# Patient Record
Sex: Female | Born: 1960 | Race: White | Hispanic: No | Marital: Married
Health system: Southern US, Community
[De-identification: ages and names within clinical notes are randomized; demographics above are authoritative.]

---

## 1999-01-26 ENCOUNTER — Ambulatory Visit (HOSPITAL_COMMUNITY): Admission: RE | Admit: 1999-01-26 | Discharge: 1999-01-26 | Payer: Self-pay | Admitting: Obstetrics and Gynecology

## 1999-01-26 ENCOUNTER — Encounter: Payer: Self-pay | Admitting: Obstetrics and Gynecology

## 1999-06-21 ENCOUNTER — Inpatient Hospital Stay (HOSPITAL_COMMUNITY): Admission: AD | Admit: 1999-06-21 | Discharge: 1999-06-24 | Payer: Self-pay | Admitting: Obstetrics and Gynecology

## 2008-08-20 ENCOUNTER — Inpatient Hospital Stay: Payer: Self-pay | Admitting: Surgery

## 2009-02-20 ENCOUNTER — Ambulatory Visit: Payer: Self-pay | Admitting: Surgery

## 2009-03-07 ENCOUNTER — Ambulatory Visit: Payer: Self-pay | Admitting: Surgery

## 2009-03-14 ENCOUNTER — Inpatient Hospital Stay: Payer: Self-pay | Admitting: Surgery

## 2009-11-22 IMAGING — CR DG BARIUM ENEMA
1 series · 1 of 1 positions shown · non-contrast
Comparison: none

REASON FOR EXAM: ruptured diverticulitis July 2008 had colostomy
COMMENTS:

[view not recorded]
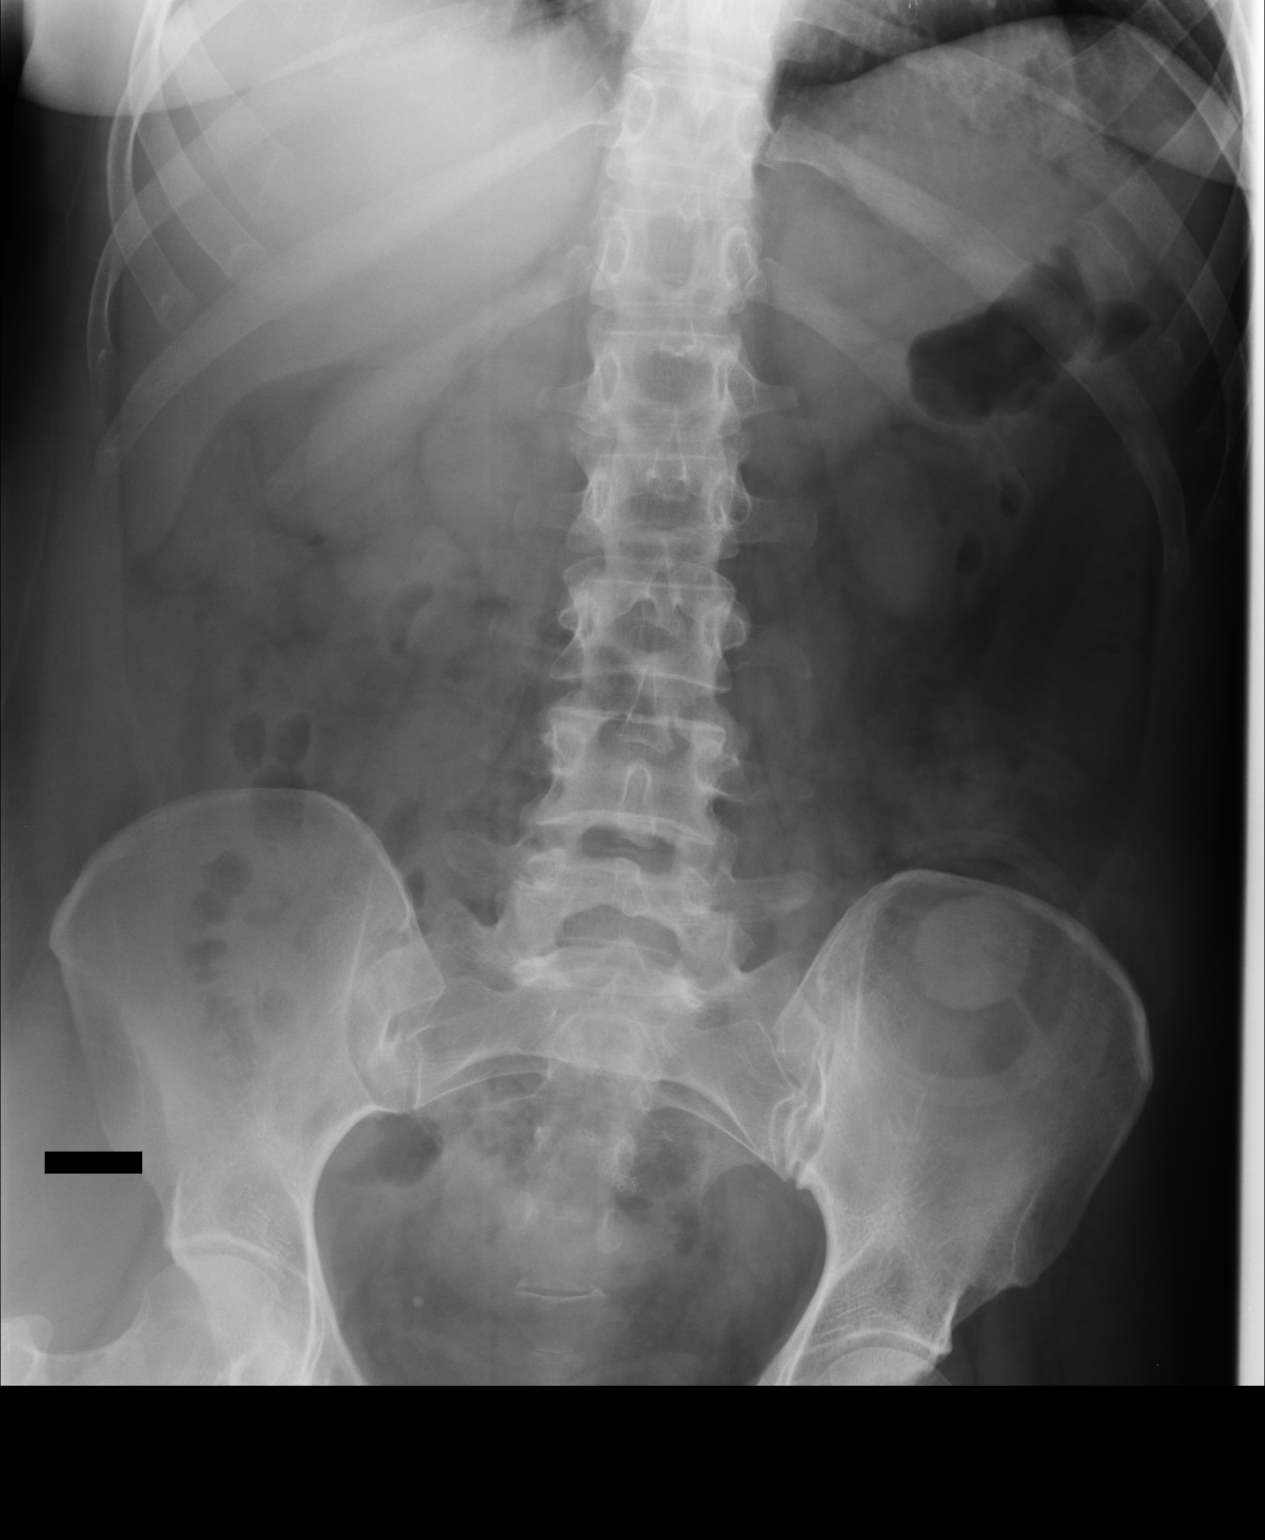

[1 of 1 positions shown; findings below may reference images not displayed]

PROCEDURE:     FL  - FL BARIUM ENEMA (COLON)  - February 20, 2009  [DATE]

RESULT:     Barium Enema was performed through the rectum into the patient's
colostomy site. No focal lesions are identified. The appendix is normal. AP
views of the rectum could not be obtained as the patient was barely able to
contain the barium in the rectum
IMPRESSION: Normal exam. Widely patent rectal pouch and widely patent
remainder of the colon.

## 2010-02-19 ENCOUNTER — Ambulatory Visit: Payer: Self-pay

## 2010-03-20 ENCOUNTER — Ambulatory Visit: Payer: Self-pay

## 2010-04-05 ENCOUNTER — Inpatient Hospital Stay: Payer: Self-pay | Admitting: Surgery

## 2011-01-06 IMAGING — CT CT CHEST W/ CM
1 series · 15 of 32 positions shown, 19 images · IV contrast (APPLIED)
Comparison: none

REASON FOR EXAM: respiratory insufficiency
COMMENTS:

[Series 4: soft tissue · axial · 0.72mm/px · z∈[+230,+454]mm · 15 of 86 slices shown, 19 images]
[im 7/86  mediastinal]
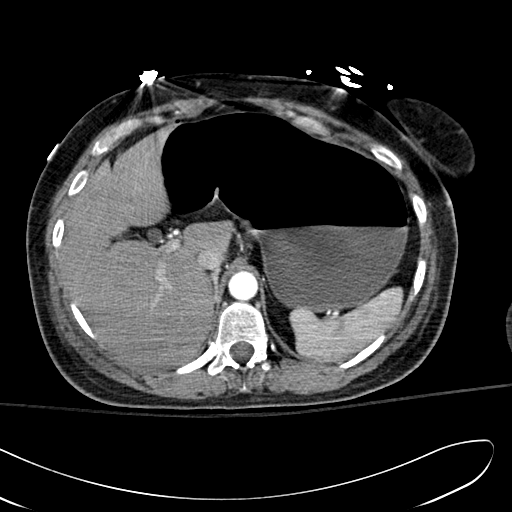
[im 7/86  lung]
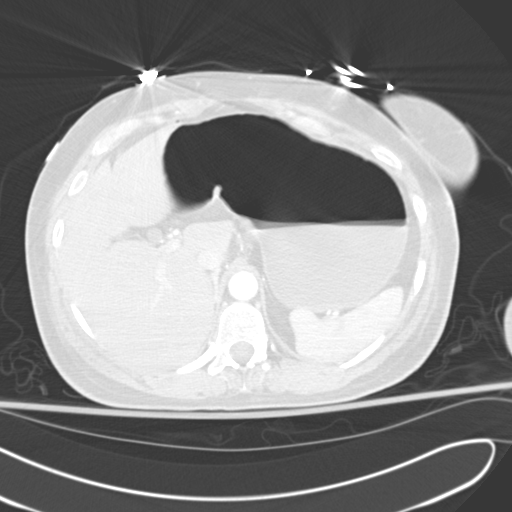
[im 13/86  lung]
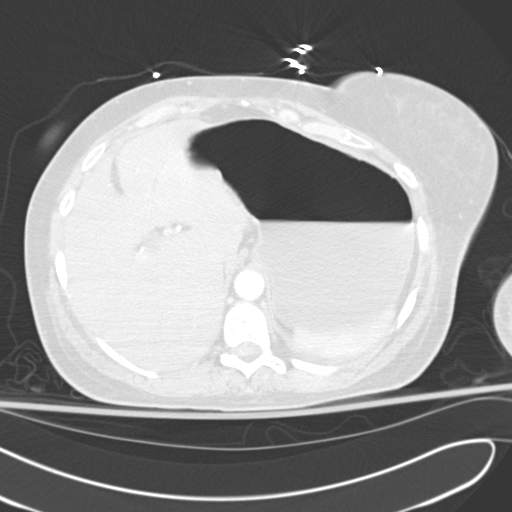
[im 18/86  lung]
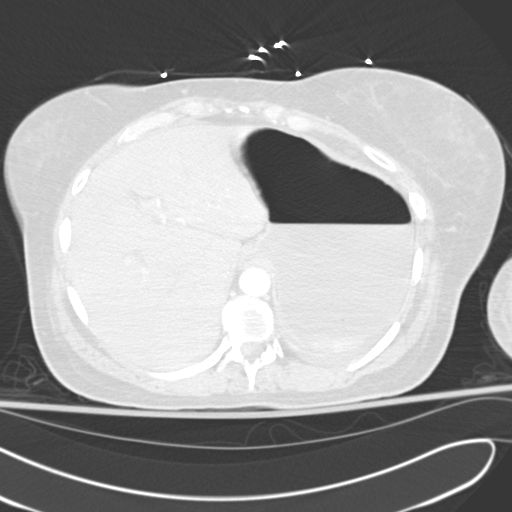
[im 23/86  lung]
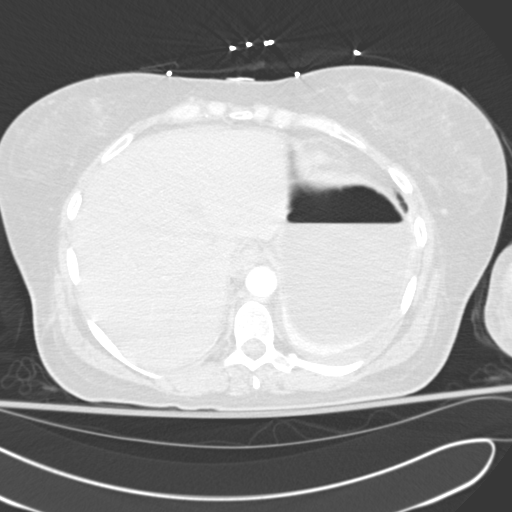
[im 29/86  mediastinal]
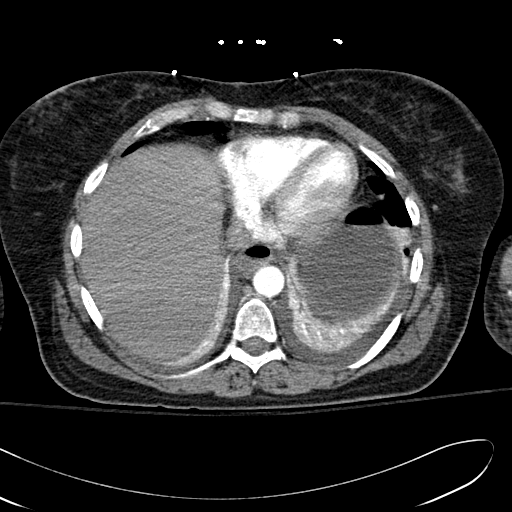
[im 29/86  lung]
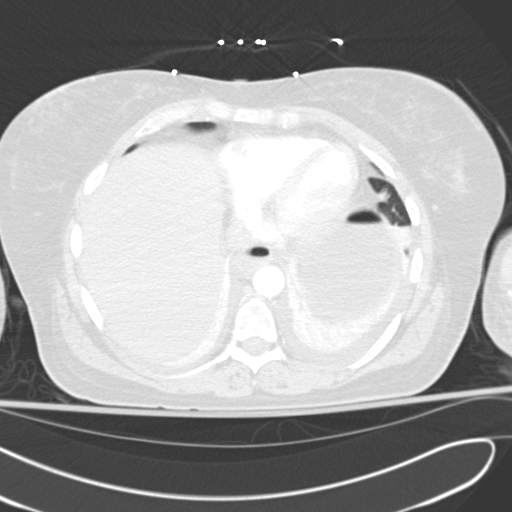
[im 35/86  lung]
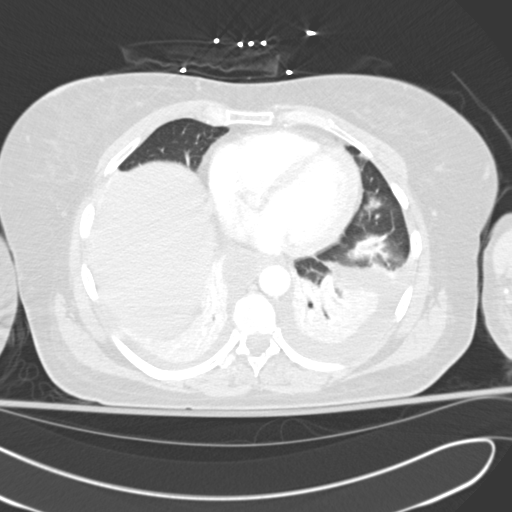
[im 41/86  lung]
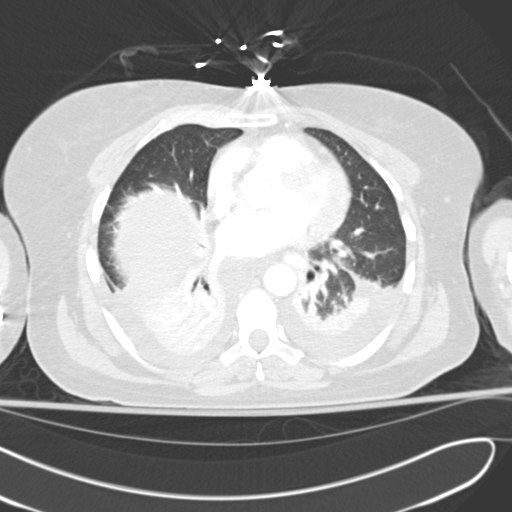
[im 46/86  lung]
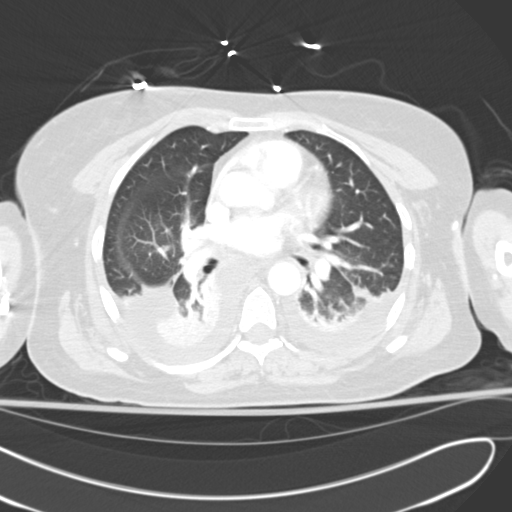
[im 51/86  mediastinal]
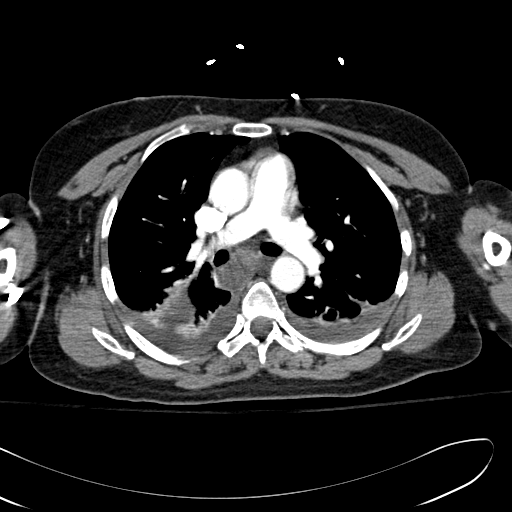
[im 51/86  lung]
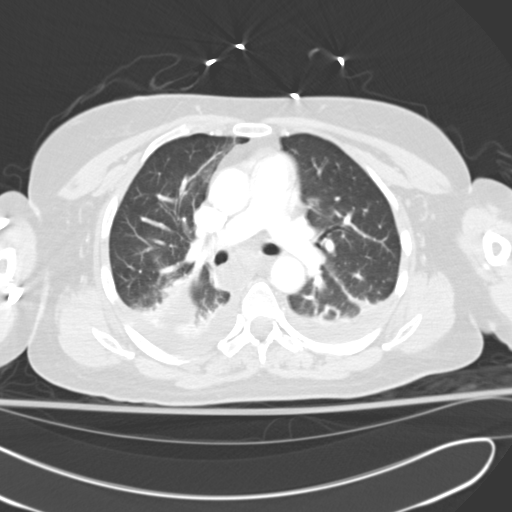
[im 54/86  lung]
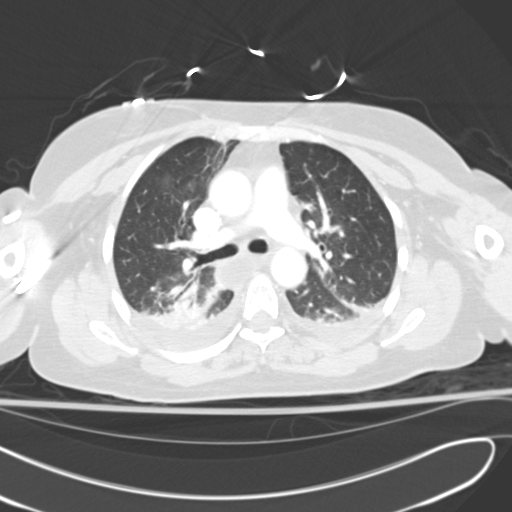
[im 60/86  lung]
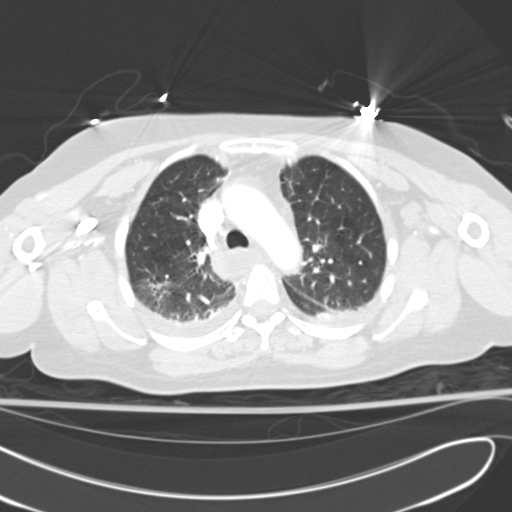
[im 67/86  lung]
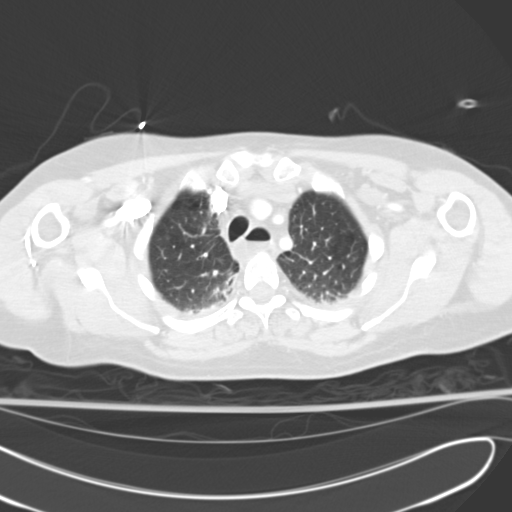
[im 70/86  mediastinal]
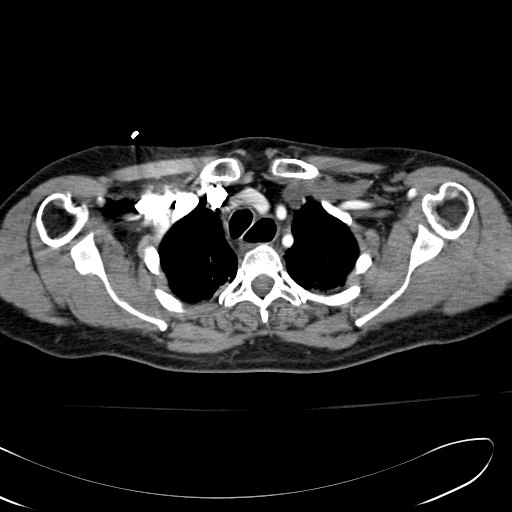
[im 70/86  lung]
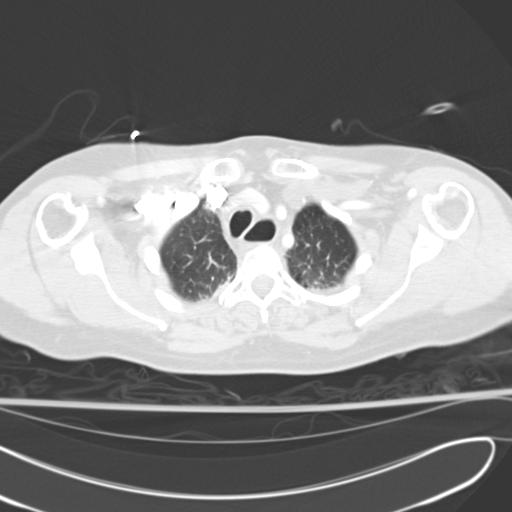
[im 76/86  lung]
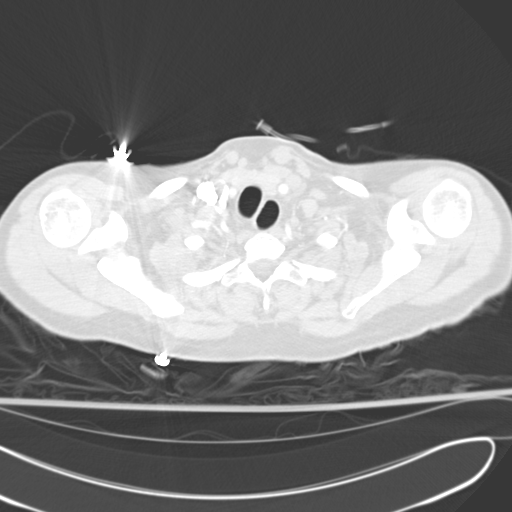
[im 82/86  lung]
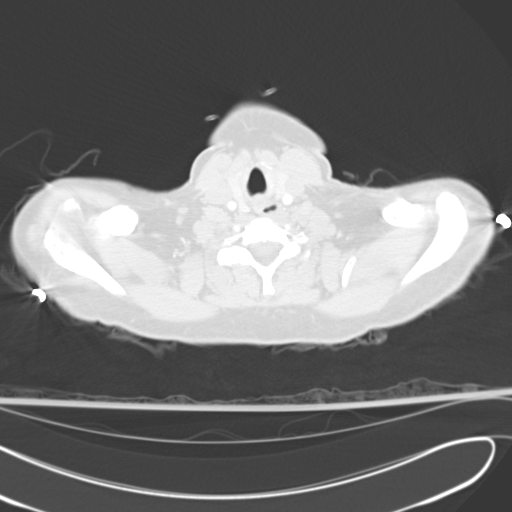

[15 of 32 positions shown; findings below may reference images not displayed]

PROCEDURE:     CT  - CT CHEST (FOR PE) W  - April 06, 2010  [DATE]

RESULT:     CT of the chest is performed with 85 mL of Isovue-NJV iodinated
intravenous contrast. Images are reconstructed at 3 mm slice thickness in
the axial plane. There is no previous exam for comparison.

Incidental note is made of a large amount of fluid and air within the
stomach. This is incompletely imaged. The patient may benefit from a
nasogastric tube.

Small bilateral pleural effusions and bilateral lower lobe areas of
infiltrate versus atelectasis with minimal upper lobe atelectasis versus
infiltrate are noted. There is no edema or pneumothorax. Minimal lingular
atelectasis is demonstrated. There is an air-fluid level within the distal
esophagus with a distended fluid-filled esophagus suggestive of possible
gastroesophageal reflux. There is air within the proximal esophagus. A
borderline enlarged para-aortic lymph node is seen adjacent to the aortic
arch measuring up to 1.5 cm long axis and only 7.7 mm short axis on image
29. The thoracic aorta is normal in caliber and shows no evidence of
dissection. There is no evidence of a filling defect within the well
opacified pulmonary arterial system to suggest PE.
IMPRESSION: 1. Gastric distention with fluid and air in the esophagus and stomach. The
patient may benefit from a nasogastric tube or decompression.
2. Small bilateral pleural effusions with lower lobe atelectasis versus
infiltrate and probable minimal upper lobe atelectasis.
3. No pulmonary embolism, aortic aneurysm or thoracic aortic dissection.

## 2017-10-30 DIAGNOSIS — C4492 Squamous cell carcinoma of skin, unspecified: Secondary | ICD-10-CM

## 2017-10-30 HISTORY — DX: Squamous cell carcinoma of skin, unspecified: C44.92

## 2018-09-03 DIAGNOSIS — L570 Actinic keratosis: Secondary | ICD-10-CM

## 2018-09-03 HISTORY — DX: Actinic keratosis: L57.0

## 2019-11-26 ENCOUNTER — Ambulatory Visit: Payer: Self-pay

## 2019-11-27 ENCOUNTER — Ambulatory Visit: Payer: Self-pay

## 2019-11-28 ENCOUNTER — Ambulatory Visit: Payer: Self-pay

## 2019-12-03 ENCOUNTER — Ambulatory Visit: Payer: Self-pay

## 2020-08-03 ENCOUNTER — Encounter: Payer: Self-pay | Admitting: Dermatology

## 2020-08-03 ENCOUNTER — Other Ambulatory Visit: Payer: Self-pay

## 2020-08-03 ENCOUNTER — Ambulatory Visit: Payer: BC Managed Care – PPO | Admitting: Dermatology

## 2020-08-03 DIAGNOSIS — Z85828 Personal history of other malignant neoplasm of skin: Secondary | ICD-10-CM

## 2020-08-03 DIAGNOSIS — L820 Inflamed seborrheic keratosis: Secondary | ICD-10-CM | POA: Diagnosis not present

## 2020-08-03 DIAGNOSIS — L578 Other skin changes due to chronic exposure to nonionizing radiation: Secondary | ICD-10-CM

## 2020-08-03 DIAGNOSIS — L821 Other seborrheic keratosis: Secondary | ICD-10-CM

## 2020-08-03 NOTE — Progress Notes (Signed)
   Follow-Up Visit   Subjective  Stephanie Garrett is a 59 y.o. female who presents for the following: Other (Spot on chest and back and a few spots of left face that are irritating.).  The following portions of the chart were reviewed this encounter and updated as appropriate:  Allergies  Meds  Problems  Med Hx  Surg Hx  Fam Hx     Review of Systems:  No other skin or systemic complaints except as noted in HPI or Assessment and Plan.  Objective  Well appearing patient in no apparent distress; mood and affect are within normal limits.  A focused examination was performed including face, chest, arms, back. Relevant physical exam findings are noted in the Assessment and Plan.  Objective  right forearm x 1, chest x 7, ant forehead x 1, back x 5, left sideburn x 2, right forehead x 1, right temple x 1 (18): Erythematous keratotic or waxy stuck-on papule or plaque.    Assessment & Plan    Actinic Damage - chronic, secondary to cumulative UV radiation exposure/sun exposure over time - diffuse scaly erythematous macules with underlying dyspigmentation - Recommend daily broad spectrum sunscreen SPF 30+ to sun-exposed areas, reapply every 2 hours as needed.  - Call for new or changing lesions.  Seborrheic Keratoses - Stuck-on, waxy, tan-brown papules and plaques  - Discussed benign etiology and prognosis. - Observe - Call for any changes  History of Squamous Cell Carcinoma of the Skin - No evidence of recurrence today - No lymphadenopathy - Recommend regular full body skin exams - Recommend daily broad spectrum sunscreen SPF 30+ to sun-exposed areas, reapply every 2 hours as needed.  - Call if any new or changing lesions are noted between office visits   Inflamed seborrheic keratosis (18) right forearm x 1, chest x 7, ant forehead x 1, back x 5, left sideburn x 2, right forehead x 1, right temple x 1  Destruction of lesion - right forearm x 1, chest x 7, ant forehead x 1,  back x 5, left sideburn x 2, right forehead x 1, right temple x 1 Complexity: simple   Destruction method: cryotherapy   Informed consent: discussed and consent obtained   Timeout:  patient name, date of birth, surgical site, and procedure verified Lesion destroyed using liquid nitrogen: Yes   Region frozen until ice ball extended beyond lesion: Yes   Outcome: patient tolerated procedure well with no complications   Post-procedure details: wound care instructions given    Return in about 6 months (around 01/31/2021) for TBSE.   I, Ashok Cordia, CMA, am acting as scribe for Sarina Ser, MD .  Documentation: I have reviewed the above documentation for accuracy and completeness, and I agree with the above.  Sarina Ser, MD

## 2020-08-03 NOTE — Patient Instructions (Signed)

## 2020-08-07 ENCOUNTER — Encounter: Payer: Self-pay | Admitting: Dermatology

## 2021-02-07 ENCOUNTER — Encounter: Payer: BC Managed Care – PPO | Admitting: Dermatology

## 2021-11-27 ENCOUNTER — Other Ambulatory Visit: Payer: Self-pay

## 2021-11-27 ENCOUNTER — Ambulatory Visit: Payer: BC Managed Care – PPO | Admitting: Dermatology

## 2021-11-27 DIAGNOSIS — L720 Epidermal cyst: Secondary | ICD-10-CM

## 2021-11-27 NOTE — Progress Notes (Signed)
° °  Follow-Up Visit   Subjective  Stephanie Garrett is a 61 y.o. female who presents for the following: check spot (R glabella, ~14yrs, pt feels it will get larger and then smaller). The patient has spots, moles and lesions to be evaluated, some may be new or changing and the patient has concerns that these could be cancer.  The following portions of the chart were reviewed this encounter and updated as appropriate:   Allergies   Meds   Problems   Med Hx   Surg Hx   Fam Hx      Review of Systems:  No other skin or systemic complaints except as noted in HPI or Assessment and Plan.  Objective  Well appearing patient in no apparent distress; mood and affect are within normal limits.  A focused examination was performed including face. Relevant physical exam findings are noted in the Assessment and Plan.  R nasal bridge/medial canthus Cystic pap 1.0 x 0.7cm      Assessment & Plan  Epidermal cyst R nasal bridge/medial canthus  Growing Discussed excising, pt will schedule Pre-Op info given  Return for To be scheduled for surgery cyst R nasal bridge/medial canthus.  I, Othelia Pulling, RMA, am acting as scribe for Sarina Ser, MD . Documentation: I have reviewed the above documentation for accuracy and completeness, and I agree with the above.  Sarina Ser, MD

## 2021-11-27 NOTE — Patient Instructions (Addendum)
Pre-Operative Instructions  You are scheduled for a surgical procedure at [facility]. We recommend you read the following instructions. If you have any questions or concerns, please call the office at [phone].  Shower and wash the entire body with soap and water the day of your surgery paying special attention to cleansing at and around the planned surgery site.  Avoid aspirin or aspirin containing products at least fourteen (14) days prior to your surgical procedure and for at least one week (7 Days) after your surgical procedure. If you take aspirin on a regular basis for heart disease or history of stroke or for any other reason, we may recommend you continue taking aspirin but please notify us if you take this on a regular basis. Aspirin can cause more bleeding to occur during surgery as well as prolonged bleeding and bruising after surgery.   Avoid other nonsteroidal pain medications at least one week prior to surgery and at least one week prior to your surgery. These include medications such as Ibuprofen (Motrin, Advil and Nuprin), Naprosyn, Voltaren, Relafen, etc. If medications are used for therapeutic reasons, please inform us as they can cause increased bleeding or prolonged bleeding during and bruising after surgical procedures.   Please advise [place] if you are taking any "blood thinner" medications such as Coumadin or Dipyridamole or Plavix or similar medications. These cause increased bleeding and prolonged bleeding during procedures and bruising after surgical procedures. We may have to consider discontinuing these medications briefly prior to and shortly after your surgery if safe to do so.   Please inform us of all medications you are currently taking. All medications that are taken regularly should be taken the day of surgery as you always do. Nevertheless, we need to be informed of what medications you are taking prior to surgery to know whether they will affect the  procedure or cause any complications.   Please inform us of any medication allergies. Also inform us of whether you have allergies to Latex or rubber products or whether you have had any adverse reaction to Lidocaine or Epinephrine.  Please inform us of any prosthetic or artificial body parts such as artificial heart valve, joint replacements, etc., or similar condition that might require preoperative antibiotics.   We recommend avoidance of alcohol at least two weeks prior to surgery and continued avoidance for at least two weeks after surgery.   We recommend discontinuation of tobacco smoking at least two weeks prior to surgery and continued abstinence for at least two weeks after surgery.  Do not plan strenuous exercise, strenuous work or strenuous lifting for approximately four weeks after your surgery.   We request if you are unable to make your scheduled surgical appointment, please call us at least a week in advance or as soon as you are aware of a problem so that we can cancel or reschedule the appointment.   You MAY TAKE TYLENOL (acetaminophen) for pain as it is not a blood thinner.   PLEASE PLAN TO BE IN TOWN FOR TWO WEEKS FOLLOWING SURGERY, THIS IS IMPORTANT SO YOU CAN BE CHECKED FOR DRESSING CHANGES, SUTURE REMOVAL AND TO MONITOR FOR POSSIBLE COMPLICATIONS.     If You Need Anything After Your Visit  If you have any questions or concerns for your doctor, please call our main line at [phone] and press option 4 to reach your doctor's medical assistant. If no one answers, please leave a voicemail as directed and we will return your call as  soon as possible. Messages left after 4 pm will be answered the following business day.   You may also send [place] a message via [place]. We typically respond to MyChart messages within 1-2 business days.  For prescription refills, please ask your pharmacy to contact our office. Our fax number is [phone].  If you have an urgent issue when  the clinic is closed that cannot wait until the next business day, you can page your doctor at the number below.    Please note that while we do our best to be available for urgent issues outside of office hours, we are not available 24/7.   If you have an urgent issue and are unable to reach [place], you may choose to seek medical care at your doctor's office, retail clinic, urgent care center, or emergency room.  If you have a medical emergency, please immediately call 911 or go to the emergency department.  Pager Numbers  - Dr. Nehemiah Massed: [phone]  - Dr. Laurence Ferrari: [phone]  - Dr. Nicole Kindred: [phone]  In the event of inclement weather, please call our main line at [phone] for an update on the status of any delays or closures.  Dermatology Medication Tips: Please keep the boxes that topical medications come in in order to help keep track of the instructions about where and how to use these. Pharmacies typically print the medication instructions only on the boxes and not directly on the medication tubes.   If your medication is too expensive, please contact our office at [phone] option 4 or send [place] a message through [place].   We are unable to tell what your co-pay for medications will be in advance as this is different depending on your insurance coverage. However, we may be able to find a substitute medication at lower cost or fill out paperwork to get insurance to cover a needed medication.   If a prior authorization is required to get your medication covered by your insurance company, please allow [place] 1-2 business days to complete this process.  Drug prices often vary depending on where the prescription is filled and some pharmacies may offer cheaper prices.  The website www.goodrx.com contains coupons for medications through different pharmacies. The prices here do not account for what the cost may be with help from insurance (it may be cheaper with your insurance), but the  website can give you the price if you did not use any insurance.  - You can print the associated coupon and take it with your prescription to the pharmacy.  - You may also stop by our office during regular business hours and pick up a GoodRx coupon card.  - If you need your prescription sent electronically to a different pharmacy, notify our office through [facility] or by phone at [phone] option 4.     Si Usted Necesita Algo Despus de Su Visita  Tambin puede enviarnos un mensaje a travs de Pharmacist, community. Por lo general respondemos a los mensajes de MyChart en el transcurso de 1 a 2 das hbiles.  Para renovar recetas, por favor pida a su farmacia que se ponga en contacto con nuestra oficina. Harland Dingwall de fax es [place] [phone].  Si tiene un asunto urgente cuando la clnica est cerrada y que no puede esperar hasta el siguiente da hbil, puede llamar/localizar a su doctor(a) al nmero que aparece a continuacin.   Por favor, tenga en cuenta que aunque hacemos todo lo posible para estar disponibles para asuntos urgentes fuera del horario  de oficina, no estamos disponibles las 24 horas del da, los 7 das de la [place].   Si tiene un problema urgente y no puede comunicarse con nosotros, puede optar por buscar atencin mdica  en el consultorio de su doctor(a), en una clnica privada, en un centro de atencin urgente o en una sala de emergencias.  Si tiene Engineering geologist, por favor llame inmediatamente al 911 o vaya a la sala de emergencias.  Nmeros de bper  - Dr. Nehemiah Massed: [phone]  - Dra. Moye: [phone]  - Dra. Nicole Kindred: [phone]  En caso de inclemencias del [place], por favor llame a Johnsie Kindred principal al [phone] para una actualizacin sobre el [place] de cualquier retraso o cierre.  Consejos para la medicacin en dermatologa: Por favor, guarde las cajas en las que vienen los medicamentos de uso tpico para ayudarle a seguir las  instrucciones sobre dnde y cmo usarlos. Las farmacias generalmente imprimen las instrucciones del medicamento slo en las cajas y no directamente en los tubos del [place].   Si su medicamento es muy caro, por favor, pngase en contacto con Zigmund Daniel llamando al [phone] y presione la opcin 4 o envenos un mensaje a travs de Pharmacist, community.   No podemos decirle cul ser su copago por los medicamentos por adelantado ya que esto es diferente dependiendo de la cobertura de su seguro. Sin embargo, es posible que podamos encontrar un medicamento sustituto a Electrical engineer un formulario para que el seguro cubra el medicamento que se considera necesario.   Si se requiere una autorizacin previa para que su compaa de seguros [place] su medicamento, por favor permtanos de 1 a 2 das hbiles para completar este proceso.  Los precios de los medicamentos varan con frecuencia dependiendo del Environmental consultant de dnde se surte la receta y alguna farmacias pueden ofrecer precios ms baratos.  El sitio web www.goodrx.com tiene cupones para medicamentos de Airline pilot. Los precios aqu no tienen en cuenta lo que podra costar con la ayuda del seguro (puede ser ms barato con su seguro), pero el sitio web puede darle el precio si no utiliz Research scientist (physical sciences).  - Puede imprimir el cupn correspondiente y llevarlo con su receta a la farmacia.  - Tambin puede pasar por nuestra oficina durante el horario de atencin regular y Charity fundraiser una tarjeta de cupones de GoodRx.  - Si necesita que su receta se enve electrnicamente a una farmacia diferente, informe a nuestra oficina a travs de MyChart de [place] o por telfono llamando al [phone] y presione la opcin 4.

## 2021-11-29 ENCOUNTER — Encounter: Payer: Self-pay | Admitting: Dermatology

## 2021-12-17 ENCOUNTER — Encounter: Payer: Self-pay | Admitting: Dermatology

## 2022-01-15 ENCOUNTER — Ambulatory Visit: Payer: BC Managed Care – PPO | Admitting: Dermatology

## 2022-01-15 ENCOUNTER — Telehealth: Payer: Self-pay

## 2022-01-15 DIAGNOSIS — L722 Steatocystoma multiplex: Secondary | ICD-10-CM

## 2022-01-15 DIAGNOSIS — D485 Neoplasm of uncertain behavior of skin: Secondary | ICD-10-CM

## 2022-01-15 MED ORDER — MUPIROCIN 2 % EX OINT: 1.0000 "application " | TOPICAL_OINTMENT | Freq: Every day | CUTANEOUS | 0 refills | Status: AC

## 2022-01-15 NOTE — Patient Instructions (Signed)
Wound Care Instructions ? ?On the day following your surgery, you should begin doing daily dressing changes: ?Remove the old dressing and discard it. ?Cleanse the wound gently with tap water. This may be done in the shower or by placing a wet gauze pad directly on the wound and letting it soak for several minutes. ?It is important to gently remove any dried blood from the wound in order to encourage healing. This may be done by gently rolling a moistened Q-tip on the dried blood. Do not pick at the wound. ?If the wound should start to bleed, continue cleaning the wound, then place a moist gauze pad on the wound and hold pressure for a few minutes.  ?Make sure you then dry the skin surrounding the wound completely or the tape will not stick to the skin. Do not use cotton balls on the wound. ?After the wound is clean and dry, apply the ointment gently with a Q-tip. ?Cut a non-stick pad to fit the size of the wound. Lay the pad flush to the wound. If the wound is draining, you may want to reinforce it with a small amount of gauze on top of the non-stick pad for a little added compression to the area. ?Use the tape to seal the area completely. ?Select from the following with respect to your individual situation: ?If your wound has been stitched closed: continue the above steps 1-8 at least daily until your sutures are removed. ?If your wound has been left open to heal: continue steps 1-8 at least daily for the first 3-4 weeks. ?We would like for you to take a few extra precautions for at least the next week. ?Sleep with your head elevated on pillows if our wound is on your head. ?Do not bend over or lift heavy items to reduce the chance of elevated blood pressure to the wound ?Do not participate in particularly strenuous activities. ? ? ?Below is a list of dressing supplies you might need.  ?Cotton-tipped applicators - Q-tips ?Gauze pads (2x2 and/or 4x4) - All-Purpose Sponges ?Non-stick dressing material - Telfa ?Tape -  Paper or Hypafix ?New and clean tube of petroleum jelly - Vaseline  ? ? ?Comments on Post-Operative Period ?Slight swelling and redness often appear around the wound. This is normal and will disappear within several days following the surgery. ?The healing wound will drain a brownish-red-yellow discharge during healing. This is a normal phase of wound healing. As the wound begins to heal, the drainage may increase in amount. Again, this drainage is normal. ?Notify us if the drainage becomes persistently bloody, excessively swollen, or intensely painful or develops a foul odor or red streaks.  ?If you should experience mild discomfort during the healing phase, you may take an aspirin-free medication such as Tylenol (acetaminophen). Notify us if the discomfort is severe or persistent. Avoid alcoholic beverages when taking pain medicine. ? ?In Case of Wound Hemorrhage ?A wound hemorrhage is when the bandage suddenly becomes soaked with bright red blood and flows profusely. If this happens, sit down or lie down with your head elevated. If the wound has a dressing on it, do not remove the dressing. Apply pressure to the existing gauze. If the wound is not covered, use a gauze pad to apply pressure and continue applying the pressure for 20 minutes without peeking. DO NOT COVER THE WOUND WITH A LARGE TOWEL OR WASH CLOTH. Release your hand from the wound site but do not remove the dressing. If the bleeding has stopped,   gently clean around the wound. Leave the dressing in place for 24 hours if possible. This wait time allows the blood vessels to close off so that you do not spark a new round of bleeding by disrupting the newly clotted blood vessels with an immediate dressing change. If the bleeding does not subside, continue to hold pressure. If matters are out of your control, contact an After Hours clinic or go to the Emergency Room. ? ? ? ?If You Need Anything After Your Visit ? ?If you have any questions or concerns for  your doctor, please call our main line at [phone] and press option 4 to reach your doctor's medical assistant. If no one answers, please leave a voicemail as directed and we will return your call as soon as possible. Messages left after 4 pm will be answered the following business day.  ? ?You may also send [place] a message via MyChart. We typically respond to MyChart messages within 1-2 business days. ? ?For prescription refills, please ask your pharmacy to contact our office. Our fax number is [phone]. ? ?If you have an urgent issue when the clinic is closed that cannot wait until the next business day, you can page your doctor at the number below.   ? ?Please note that while we do our best to be available for urgent issues outside of office hours, we are not available 24/7.  ? ?If you have an urgent issue and are unable to reach [place], you may choose to seek medical care at your doctor's office, retail clinic, urgent care center, or emergency room. ? ?If you have a medical emergency, please immediately call 911 or go to the emergency department. ? ?Pager Numbers ? ?- Dr. Nehemiah Massed: [phone] ? ?- Dr. Laurence Ferrari: [phone] ? ?- Dr. Nicole Kindred: [phone] ? ?In the event of inclement weather, please call our main line at [phone] for an update on the status of any delays or closures. ? ?Dermatology Medication Tips: ?Please keep the boxes that topical medications come in in order to help keep track of the instructions about where and how to use these. Pharmacies typically print the medication instructions only on the boxes and not directly on the medication tubes.  ? ?If your medication is too expensive, please contact our office at [phone] option 4 or send [place] a message through [place].  ? ?We are unable to tell what your co-pay for medications will be in advance as this is different depending on your insurance coverage. However, we may be able to find a substitute medication at lower cost or fill out  paperwork to get insurance to cover a needed medication.  ? ?If a prior authorization is required to get your medication covered by your insurance company, please allow [place] 1-2 business days to complete this process. ? ?Drug prices often vary depending on where the prescription is filled and some pharmacies may offer cheaper prices. ? ?The website www.goodrx.com contains coupons for medications through different pharmacies. The prices here do not account for what the cost may be with help from insurance (it may be cheaper with your insurance), but the website can give you the price if you did not use any insurance.  ?- You can print the associated coupon and take it with your prescription to the pharmacy.  ?- You may also stop by our office during regular business hours and pick up a GoodRx coupon card.  ?- If you need your prescription sent electronically to a different pharmacy, notify our  office through [facility] or by phone at [phone] option 4. ? ? ? ? ?Si Usted Necesita Algo Despu?s de Su Visita ? ?Tambi?n puede enviarnos un mensaje a trav?s de MyChart. Por lo general respondemos a los mensajes de MyChart en el transcurso de 1 a 2 d?as h?biles. ? ?Para renovar recetas, por favor pida a su farmacia que se ponga en contacto con nuestra oficina. Nuestro n?mero de fax es el [phone]. ? ?Si tiene un asunto urgente cuando la cl?nica est? cerrada y que no puede esperar hasta el siguiente d?a h?bil, puede llamar/localizar a su doctor(a) al n?mero que aparece a continuaci?n.  ? ?Por favor, tenga en cuenta que aunque hacemos todo lo posible para estar disponibles para asuntos urgentes fuera del horario de oficina, no estamos disponibles las 24 horas del d?a, los 7 d?as de la semana.  ? ?Si tiene un problema urgente y no puede comunicarse con nosotros, puede optar por buscar atenci?n m?dica  en el consultorio de su doctor(a), en una cl?nica privada, en un centro de atenci?n urgente o en una sala de  emergencias. ? ?Si tiene Engineer, maintenance (IT) m?dica, por favor llame inmediatamente al 911 o vaya a la sala de emergencias. ? ?N?meros de b?per ? ?- Dr. Nehemiah Massed: [phone] ? ?- Dra. Moye: [phone] ? ?- Dra

## 2022-01-15 NOTE — Telephone Encounter (Signed)
Pt doing fine after today's surgery./sh 

## 2022-01-15 NOTE — Progress Notes (Signed)
? ?  Follow-Up Visit ?  ?Subjective  ?Stephanie Garrett is a 60 y.o. female who presents for the following: Cyst (Vs other of right nasal root/right upper eyelid medial canthus - excise today). ? ?The following portions of the chart were reviewed this encounter and updated as appropriate:  ? Allergies  Meds  Problems  Med Hx  Surg Hx  Fam Hx   ?  ?Review of Systems:  No other skin or systemic complaints except as noted in HPI or Assessment and Plan. ? ?Objective  ?Well appearing patient in no apparent distress; mood and affect are within normal limits. ? ?A focused examination was performed including face. Relevant physical exam findings are noted in the Assessment and Plan. ? ?Right nasal root/ R upper eyelid medial canthus ?1.1 cm cystic papule ? ? ?Assessment & Plan  ?Neoplasm of uncertain behavior of skin ?Right nasal root/right upper eyelid medial canthus ? ?Skin excision ? ?Lesion length (cm):  1.1 ?Lesion width (cm):  1.1 ?Margin per side (cm):  0 ?Total excision diameter (cm):  1.1 ?Informed consent: discussed and consent obtained   ?Timeout: patient name, date of birth, surgical site, and procedure verified   ?Procedure prep:  Patient was prepped and draped in usual sterile fashion ?Prep type:  Isopropyl alcohol and povidone-iodine ?Anesthesia: the lesion was anesthetized in a standard fashion   ?Anesthetic:  1% lidocaine w/ epinephrine 1-100,000 buffered w/ 8.4% NaHCO3 ?Instrument used: #15 blade   ?Hemostasis achieved with: pressure   ?Hemostasis achieved with comment:  Electrocautery ?Outcome: patient tolerated procedure well with no complications   ?Post-procedure details: sterile dressing applied and wound care instructions given   ?Dressing type: bandage and pressure dressing (mupirocin)   ? ?Skin repair ?Complexity:  Simple ?Undermining: area extensively undermined   ?Undermining comment:  Undermining defect  ?Fine/surface layer approximation (top stitches):  ?Suture type: nylon   ?Stitches:  simple running   ?Suture removal (days):  7 ?Hemostasis achieved with: suture and pressure ?Outcome: patient tolerated procedure well with no complications   ?Post-procedure details: sterile dressing applied and wound care instructions given   ?Dressing type: bandage and pressure dressing (mupirocin)   ? ?mupirocin ointment (BACTROBAN) 2 % ?Apply 1 application. topically daily. With dressing changes ? ?Specimen 1 - Surgical pathology ?Differential Diagnosis: Cyst vs other ?Check Margins: No ? ?Return for 6-7 days , suture removal. ? ?I, Ashok Cordia, CMA, am acting as scribe for Sarina Ser, MD . ?Documentation: I have reviewed the above documentation for accuracy and completeness, and I agree with the above. ? ?Sarina Ser, MD ? ?

## 2022-01-17 ENCOUNTER — Telehealth: Payer: Self-pay

## 2022-01-17 NOTE — Telephone Encounter (Signed)
Called pt discussed biopsy results,  ?

## 2022-01-17 NOTE — Telephone Encounter (Signed)
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

## 2022-01-22 ENCOUNTER — Encounter: Payer: Self-pay | Admitting: Dermatology

## 2022-01-22 ENCOUNTER — Ambulatory Visit (INDEPENDENT_AMBULATORY_CARE_PROVIDER_SITE_OTHER): Payer: BC Managed Care – PPO | Admitting: Dermatology

## 2022-01-22 DIAGNOSIS — L905 Scar conditions and fibrosis of skin: Secondary | ICD-10-CM

## 2022-01-22 DIAGNOSIS — Z4802 Encounter for removal of sutures: Secondary | ICD-10-CM

## 2022-01-22 DIAGNOSIS — L722 Steatocystoma multiplex: Secondary | ICD-10-CM

## 2022-01-22 NOTE — Patient Instructions (Signed)

## 2022-01-22 NOTE — Progress Notes (Signed)
? ?  Follow-Up Visit ?  ?Subjective  ?Stephanie Garrett is a 61 y.o. female who presents for the following: Follow-up (Suture removal right nasal root/medial canthus biopsy proven Steatocystoma ). ? ?The following portions of the chart were reviewed this encounter and updated as appropriate:  ? Allergies  Meds  Problems  Med Hx  Surg Hx  Fam Hx   ?  ?Review of Systems:  No other skin or systemic complaints except as noted in HPI or Assessment and Plan. ? ?Objective  ?Well appearing patient in no apparent distress; mood and affect are within normal limits. ? ?A focused examination was performed including face. Relevant physical exam findings are noted in the Assessment and Plan. ? ?right nasal root/medial canthus ?Well healed scar  ? ? ?Assessment & Plan  ?Scar ?right nasal root/medial canthus ? ?Biopsy proven STEATOCYSTOMA CYST ? ?Encounter for Removal of Sutures ?- Incision site at the right nasal root/medial canthus is clean, dry and intact ?- Wound cleansed, sutures removed, wound cleansed and steri strips applied.  ?- Discussed pathology results showing  STEATOCYSTOMA CYST  ?- Patient advised to keep steri-strips dry until they fall off. ?- Scars remodel for a full year. ?- Once steri-strips fall off, patient can apply over-the-counter silicone scar cream each night to help with scar remodeling if desired. ?- Patient advised to call with any concerns or if they notice any new or changing lesions.  ? ? ?Return if symptoms worsen or fail to improve. ? ?I, Marye Round, CMA, am acting as scribe for Sarina Ser, MD .  ?Documentation: I have reviewed the above documentation for accuracy and completeness, and I agree with the above. ? ?Sarina Ser, MD ? ?

## 2022-01-29 ENCOUNTER — Encounter: Payer: Self-pay | Admitting: Dermatology

## 2022-06-26 ENCOUNTER — Encounter: Payer: Self-pay | Admitting: Dermatology

## 2022-06-26 ENCOUNTER — Ambulatory Visit: Payer: BC Managed Care – PPO | Admitting: Dermatology

## 2022-06-26 DIAGNOSIS — L821 Other seborrheic keratosis: Secondary | ICD-10-CM

## 2022-06-26 DIAGNOSIS — L820 Inflamed seborrheic keratosis: Secondary | ICD-10-CM

## 2022-06-26 DIAGNOSIS — L578 Other skin changes due to chronic exposure to nonionizing radiation: Secondary | ICD-10-CM | POA: Diagnosis not present

## 2022-06-26 NOTE — Progress Notes (Unsigned)
   Follow-Up Visit   Subjective  Stephanie Garrett is a 61 y.o. female who presents for the following: lesion (Left temple at hairline. Dur: less than 1 month. Raised, rough. Itches at times).  The patient has spots, moles and lesions to be evaluated, some may be new or changing and the patient has concerns that these could be cancer.   The following portions of the chart were reviewed this encounter and updated as appropriate:      Review of Systems: No other skin or systemic complaints except as noted in HPI or Assessment and Plan.   Objective  Well appearing patient in no apparent distress; mood and affect are within normal limits.  A focused examination was performed including face. Relevant physical exam findings are noted in the Assessment and Plan.  Left Temple at hairline x1, left antecubitum x1, left jawline x1, left lateral cheek x3 (6) Erythematous keratotic or waxy stuck-on papule or plaque.   Assessment & Plan  Inflamed seborrheic keratosis (6) Left Temple at hairline x1, left antecubitum x1, left jawline x1, left lateral cheek x3  Symptomatic, irritating, patient would like treated.  Destruction of lesion - Left Temple at hairline x1, left antecubitum x1, left jawline x1, left lateral cheek x3  Destruction method: cryotherapy   Informed consent: discussed and consent obtained   Lesion destroyed using liquid nitrogen: Yes   Region frozen until ice ball extended beyond lesion: Yes   Outcome: patient tolerated procedure well with no complications   Post-procedure details: wound care instructions given   Additional details:  Prior to procedure, discussed risks of blister formation, small wound, skin dyspigmentation, or rare scar following cryotherapy. Recommend Vaseline ointment to treated areas while healing.    Seborrheic Keratoses - Stuck-on, waxy, tan-brown papules and/or plaques  - Benign-appearing - Discussed benign etiology and prognosis. - Observe - Call  for any changes  Actinic Damage - chronic, secondary to cumulative UV radiation exposure/sun exposure over time - diffuse scaly erythematous macules with underlying dyspigmentation - Recommend daily broad spectrum sunscreen SPF 30+ to sun-exposed areas, reapply every 2 hours as needed.  - Recommend staying in the shade or wearing long sleeves, sun glasses (UVA+UVB protection) and wide brim hats (4-inch brim around the entire circumference of the hat). - Call for new or changing lesions.   No follow-ups on file.  I, Emelia Salisbury, CMA, am acting as scribe for Brendolyn Patty, MD.

## 2022-06-26 NOTE — Patient Instructions (Signed)
Cryotherapy Aftercare  Wash gently with soap and water everyday.   Apply Vaseline daily until healed.     Seborrheic Keratosis  What causes seborrheic keratoses? Seborrheic keratoses are harmless, common skin growths that first appear during adult life.  As time goes by, more growths appear.  Some people may develop a large number of them.  Seborrheic keratoses appear on both covered and uncovered body parts.  They are not caused by sunlight.  The tendency to develop seborrheic keratoses can be inherited.  They vary in color from skin-colored to gray, brown, or even black.  They can be either smooth or have a rough, warty surface.   Seborrheic keratoses are superficial and look as if they were stuck on the skin.  Under the microscope this type of keratosis looks like layers upon layers of skin.  That is why at times the top layer may seem to fall off, but the rest of the growth remains and re-grows.    Treatment Seborrheic keratoses do not need to be treated, but can easily be removed in the office.  Seborrheic keratoses often cause symptoms when they rub on clothing or jewelry.  Lesions can be in the way of shaving.  If they become inflamed, they can cause itching, soreness, or burning.  Removal of a seborrheic keratosis can be accomplished by freezing, burning, or surgery. If any spot bleeds, scabs, or grows rapidly, please return to have it checked, as these can be an indication of a skin cancer.     Due to recent changes in healthcare laws, you may see results of your pathology and/or laboratory studies on MyChart before the doctors have had a chance to review them. We understand that in some cases there may be results that are confusing or concerning to you. Please understand that not all results are received at the same time and often the doctors may need to interpret multiple results in order to provide you with the best plan of care or course of treatment. Therefore, we ask that you  please give us 2 business days to thoroughly review all your results before contacting the office for clarification. Should we see a critical lab result, you will be contacted sooner.   If You Need Anything After Your Visit  If you have any questions or concerns for your doctor, please call our main line at [phone] and press option 4 to reach your doctor's medical assistant. If no one answers, please leave a voicemail as directed and we will return your call as soon as possible. Messages left after 4 pm will be answered the following business day.   You may also send us a message via MyChart. We typically respond to MyChart messages within 1-2 business days.  For prescription refills, please ask your pharmacy to contact our office. Our fax number is [phone].  If you have an urgent issue when the clinic is closed that cannot wait until the next business day, you can page your doctor at the number below.    Please note that while we do our best to be available for urgent issues outside of office hours, we are not available 24/7.   If you have an urgent issue and are unable to reach us, you may choose to seek medical care at your doctor's office, retail clinic, urgent care center, or emergency room.  If you have a medical emergency, please immediately call 911 or go to the emergency department.  Pager Numbers  - Dr. Kowalski:   [phone]  - Dr. Moye: [phone]  - Dr. Stewart: [phone]  In the event of inclement weather, please call our main line at [phone] for an update on the status of any delays or closures.  Dermatology Medication Tips: Please keep the boxes that topical medications come in in order to help keep track of the instructions about where and how to use these. Pharmacies typically print the medication instructions only on the boxes and not directly on the medication tubes.   If your medication is too expensive, please contact our office at  [phone] option 4 or send us a message through MyChart.   We are unable to tell what your co-pay for medications will be in advance as this is different depending on your insurance coverage. However, we may be able to find a substitute medication at lower cost or fill out paperwork to get insurance to cover a needed medication.   If a prior authorization is required to get your medication covered by your insurance company, please allow us 1-2 business days to complete this process.  Drug prices often vary depending on where the prescription is filled and some pharmacies may offer cheaper prices.  The website www.goodrx.com contains coupons for medications through different pharmacies. The prices here do not account for what the cost may be with help from insurance (it may be cheaper with your insurance), but the website can give you the price if you did not use any insurance.  - You can print the associated coupon and take it with your prescription to the pharmacy.  - You may also stop by our office during regular business hours and pick up a GoodRx coupon card.  - If you need your prescription sent electronically to a different pharmacy, notify our office through [facility] MyChart or by phone at [phone] option 4.     Si Usted Necesita Algo Despus de Su Visita  Tambin puede enviarnos un mensaje a travs de MyChart. Por lo general respondemos a los mensajes de MyChart en el transcurso de 1 a 2 das hbiles.  Para renovar recetas, por favor pida a su farmacia que se ponga en contacto con nuestra oficina. Nuestro nmero de fax es el [phone].  Si tiene un asunto urgente cuando la clnica est cerrada y que no puede esperar hasta el siguiente da hbil, puede llamar/localizar a su doctor(a) al nmero que aparece a continuacin.   Por favor, tenga en cuenta que aunque hacemos todo lo posible para estar disponibles para asuntos urgentes fuera del horario de oficina, no estamos  disponibles las 24 horas del da, los 7 das de la semana.   Si tiene un problema urgente y no puede comunicarse con nosotros, puede optar por buscar atencin mdica  en el consultorio de su doctor(a), en una clnica privada, en un centro de atencin urgente o en una sala de emergencias.  Si tiene una emergencia mdica, por favor llame inmediatamente al 911 o vaya a la sala de emergencias.  Nmeros de bper  - Dr. Kowalski: [phone]  - Dra. Moye: [phone]  - Dra. Stewart: [phone]  En caso de inclemencias del tiempo, por favor llame a nuestra lnea principal al [phone] para una actualizacin sobre el estado de cualquier retraso o cierre.  Consejos para la medicacin en dermatologa: Por favor, guarde las cajas en las que vienen los medicamentos de uso tpico para ayudarle a seguir las instrucciones sobre dnde y cmo usarlos. Las farmacias generalmente imprimen las instrucciones del medicamento slo en   las cajas y no directamente en los tubos del medicamento.   Si su medicamento es muy caro, por favor, pngase en contacto con nuestra oficina llamando al [phone] y presione la opcin 4 o envenos un mensaje a travs de MyChart.   No podemos decirle cul ser su copago por los medicamentos por adelantado ya que esto es diferente dependiendo de la cobertura de su seguro. Sin embargo, es posible que podamos encontrar un medicamento sustituto a menor costo o llenar un formulario para que el seguro cubra el medicamento que se considera necesario.   Si se requiere una autorizacin previa para que su compaa de seguros cubra su medicamento, por favor permtanos de 1 a 2 das hbiles para completar este proceso.  Los precios de los medicamentos varan con frecuencia dependiendo del lugar de dnde se surte la receta y alguna farmacias pueden ofrecer precios ms baratos.  El sitio web www.goodrx.com tiene cupones para medicamentos de diferentes farmacias. Los precios aqu no  tienen en cuenta lo que podra costar con la ayuda del seguro (puede ser ms barato con su seguro), pero el sitio web puede darle el precio si no utiliz ningn seguro.  - Puede imprimir el cupn correspondiente y llevarlo con su receta a la farmacia.  - Tambin puede pasar por nuestra oficina durante el horario de atencin regular y recoger una tarjeta de cupones de GoodRx.  - Si necesita que su receta se enve electrnicamente a una farmacia diferente, informe a nuestra oficina a travs de MyChart de [place] o por telfono llamando al [phone] y presione la opcin 4.  

## 2024-03-01 ENCOUNTER — Ambulatory Visit (INDEPENDENT_AMBULATORY_CARE_PROVIDER_SITE_OTHER): Payer: Self-pay | Admitting: Dermatology

## 2024-03-01 ENCOUNTER — Encounter: Payer: Self-pay | Admitting: Dermatology

## 2024-03-01 DIAGNOSIS — D492 Neoplasm of unspecified behavior of bone, soft tissue, and skin: Secondary | ICD-10-CM

## 2024-03-01 DIAGNOSIS — Z85828 Personal history of other malignant neoplasm of skin: Secondary | ICD-10-CM

## 2024-03-01 DIAGNOSIS — L570 Actinic keratosis: Secondary | ICD-10-CM

## 2024-03-01 DIAGNOSIS — D485 Neoplasm of uncertain behavior of skin: Secondary | ICD-10-CM

## 2024-03-01 DIAGNOSIS — L820 Inflamed seborrheic keratosis: Secondary | ICD-10-CM

## 2024-03-01 NOTE — Patient Instructions (Signed)

## 2024-03-01 NOTE — Progress Notes (Signed)
   Follow-Up Visit   Subjective  Stephanie Garrett is a 63 y.o. female who presents for the following: a spot at right upper thigh, present for about 2 months, gets irritated with clothing. Two spots at back. Patient with hx of SCC. She normally sees Dr. Bary Likes.   The patient has spots, moles and lesions to be evaluated, some may be new or changing and the patient may have concern these could be cancer.   The following portions of the chart were reviewed this encounter and updated as appropriate: medications, allergies, medical history  Review of Systems:  No other skin or systemic complaints except as noted in HPI or Assessment and Plan.  Objective  Well appearing patient in no apparent distress; mood and affect are within normal limits.   A focused examination was performed of the following areas: Back, shoulder, legs  Relevant exam findings are noted in the Assessment and Plan.  right lateral thigh 6 mm pink scaly papule  R upper back x 2, R shoulder x 1 (3) Erythematous stuck-on, waxy papule or plaque  Assessment & Plan     NEOPLASM OF UNCERTAIN BEHAVIOR OF SKIN right lateral thigh Skin / nail biopsy Type of biopsy: tangential   Informed consent: discussed and consent obtained   Timeout: patient name, date of birth, surgical site, and procedure verified   Procedure prep:  Patient was prepped and draped in usual sterile fashion Prep type:  Isopropyl alcohol Anesthesia: the lesion was anesthetized in a standard fashion   Anesthetic:  1% lidocaine w/ epinephrine 1-100,000 buffered w/ 8.4% NaHCO3 Instrument used: DermaBlade   Hemostasis achieved with: pressure and aluminum chloride   Outcome: patient tolerated procedure well   Post-procedure details: sterile dressing applied and wound care instructions given   Dressing type: bandage and petrolatum   Specimen 1 - Surgical pathology Differential Diagnosis: ISK vs SCC  Check Margins: No 6 mm pink scaly papule Related  Medications mupirocin  ointment (BACTROBAN ) 2 % Apply 1 application. topically daily. With dressing changes INFLAMED SEBORRHEIC KERATOSIS (3) R upper back x 2, R shoulder x 1 (3) Symptomatic, irritating, patient would like treated.  Benign-appearing.  Call clinic for new or changing lesions.   Destruction of lesion - R upper back x 2, R shoulder x 1 (3) Complexity: simple   Destruction method: cryotherapy   Informed consent: discussed and consent obtained   Timeout:  patient name, date of birth, surgical site, and procedure verified Lesion destroyed using liquid nitrogen: Yes   Region frozen until ice ball extended beyond lesion: Yes   Cryo cycles: 1 or 2. Outcome: patient tolerated procedure well with no complications   Post-procedure details: wound care instructions given    Return for TBSE, HxSCC.  Kerstin Peeling, RMA, am acting as scribe for Harris Liming, MD .   Documentation: I have reviewed the above documentation for accuracy and completeness, and I agree with the above.  Harris Liming, MD

## 2024-03-03 ENCOUNTER — Ambulatory Visit: Payer: Self-pay | Admitting: Dermatology

## 2024-03-03 LAB — SURGICAL PATHOLOGY

## 2024-03-03 NOTE — Telephone Encounter (Signed)
-----   Message from Wilson sent at 03/03/2024  4:59 PM EDT ----- Diagnosis: right lateral thigh :       LICHENOID AND HYPERTROPHIC ACTINIC KERATOSIS    Plan: please call: right thigh biopsy shows an inflamed precancer. They have a 1% chance of becoming a squamous skin cancer per year, but they typically resolve after removal. Please contact us  if it regrows.

## 2024-03-03 NOTE — Telephone Encounter (Signed)
 Called patient. Advised right thigh biopsy shows an inflamed precancer. They have a 1% chance of becoming a squamous skin cancer per year, but they typically resolve after removal. Please contact us  if it regrows. Patient voiced understanding.

## 2024-04-20 ENCOUNTER — Ambulatory Visit: Payer: Self-pay | Admitting: Dermatology
